# Patient Record
Sex: Male | Born: 1995 | Marital: Single
Health system: Southern US, Community
[De-identification: ages and names within clinical notes are randomized; demographics above are authoritative.]

---
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
## 2020-09-27 ENCOUNTER — Emergency Department (HOSPITAL_COMMUNITY): Payer: BLUE CROSS/BLUE SHIELD

## 2020-09-27 ENCOUNTER — Emergency Department (HOSPITAL_COMMUNITY)
Admission: EM | Admit: 2020-09-27 | Discharge: 2020-09-28 | Disposition: A | Discharge status: Home / Self Care | Payer: BLUE CROSS/BLUE SHIELD | Attending: Emergency Medicine | Admitting: Emergency Medicine

## 2020-09-27 ENCOUNTER — Other Ambulatory Visit: Payer: Self-pay

## 2020-09-27 DIAGNOSIS — R0602 Shortness of breath: Secondary | ICD-10-CM | POA: Diagnosis not present

## 2020-09-27 DIAGNOSIS — R072 Precordial pain: Secondary | ICD-10-CM

## 2020-09-27 DIAGNOSIS — F419 Anxiety disorder, unspecified: Secondary | ICD-10-CM | POA: Insufficient documentation

## 2020-09-27 LAB — CBC
HCT: 46.5 % (ref 39.0–52.0)
Hemoglobin: 16.3 g/dL (ref 13.0–17.0)
MCH: 31.5 pg (ref 26.0–34.0)
MCHC: 35.1 g/dL (ref 30.0–36.0)
MCV: 89.9 fL (ref 80.0–100.0)
Platelets: 229 10*3/uL (ref 150–400)
RBC: 5.17 MIL/uL (ref 4.22–5.81)
RDW: 11.9 % (ref 11.5–15.5)
WBC: 9.9 10*3/uL (ref 4.0–10.5)
nRBC: 0 % (ref 0.0–0.2)

## 2020-09-27 LAB — BASIC METABOLIC PANEL
Anion gap: 9 (ref 5–15)
BUN: 12 mg/dL (ref 6–20)
CO2: 23 mmol/L (ref 22–32)
Calcium: 10 mg/dL (ref 8.9–10.3)
Chloride: 107 mmol/L (ref 98–111)
Creatinine, Ser: 1.03 mg/dL (ref 0.61–1.24)
GFR, Estimated: >60 mL/min (ref >60)
Glucose, Bld: 89 mg/dL (ref 70–99)
Potassium: 3.8 mmol/L (ref 3.5–5.1)
Sodium: 139 mmol/L (ref 135–145)

## 2020-09-27 LAB — TROPONIN I (HIGH SENSITIVITY)
Troponin I (High Sensitivity): 2 ng/L (ref <18)
Troponin I (High Sensitivity): 3 ng/L (ref <18)

## 2020-09-27 MED ORDER — IOHEXOL 350 MG/ML SOLN
75.0000 mL | Freq: Once | INTRAVENOUS | Status: AC | PRN
  Administered 2020-09-27: 75 mL via INTRAVENOUS

## 2020-09-27 MED ORDER — HYDROXYZINE HCL 25 MG PO TABS
50.0000 mg | ORAL_TABLET | Freq: Once | ORAL | Status: AC
  Administered 2020-09-28: 50 mg via ORAL
  Filled 2020-09-27: qty 2

## 2020-09-27 MED ORDER — PANTOPRAZOLE SODIUM 40 MG IV SOLR
40.0000 mg | Freq: Once | INTRAVENOUS | Status: AC
  Administered 2020-09-27: 40 mg via INTRAVENOUS
  Filled 2020-09-27: qty 40

## 2020-09-27 NOTE — ED Triage Notes (Signed)
Pt c/o L sided chest pain radiating into L arm. States it has been going on x 1.5wks but getting worse today starting around 6pm. C/o difficulty getting a deep breath in and tried inhaler without relief. Hx asthma

## 2020-09-27 NOTE — ED Triage Notes (Signed)
Emergency Medicine Provider Triage Evaluation Note  Troy White , a 25 y.o. male  was evaluated in triage.  Pt complains of CP, difficulty catching breath, off and on for a few weeks, this episode started around 6PM while going out for dinner, visiting family in the area with pain to jaw and left arm, pain more severe than prior episodes, numbness to both hands. No relief with inhalers. 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 No lower extremity edema.  History of asthma, no other health problems.  Non smoker (occasional marijuana). Family history- father with MI 38yo (first MI at age 41yo), paternal grandfather with MI at young age unsure age. Maternal history of MI as well.  No family history of PE, grandmother with DVT for unknown reasons.   Review of Systems  Positive: Nausea, dry heaves, CP, SHOB Negative: Changes in bowel or bladder habits  Physical Exam  BP (!) 143/86 (BP Location: Left Arm)   Pulse 63   Temp 98.3 F (36.8 C) (Oral)   Resp 20   Ht 5\' 10"  (1.778 m)   Wt 65.8 kg   SpO2 99%   BMI 20.81 kg/m  Gen:   Awake, no distress   HEENT:  Atraumatic  Resp:  Normal effort  Cardiac:  Normal rate  Abd:   Nondistended, nontender  MSK:   Moves extremities without difficulty  Neuro:  Speech clear   Medical Decision Making  Medically screening exam initiated at 8:00 PM.  Appropriate orders placed.  Troy White was informed that the remainder of the evaluation will be completed by another provider, this initial triage assessment does not replace that evaluation, and the importance of remaining in the ED until their evaluation is complete.  Clinical Impression     Troy White 09/27/20 2005

## 2020-09-28 MED ORDER — OMEPRAZOLE 20 MG PO CPDR: 20.0000 mg | DELAYED_RELEASE_CAPSULE | Freq: Every day | ORAL | 0 refills | Status: AC

## 2020-09-28 MED ORDER — HYDROXYZINE HCL 50 MG PO TABS: 50.0000 mg | ORAL_TABLET | Freq: Four times a day (QID) | ORAL | 0 refills | Status: AC | PRN

## 2020-09-28 NOTE — Discharge Instructions (Signed)
1.  Start taking omeprazole daily.  Take this in the morning 30 minutes before you eat.  Follow instructions for gastroesophageal reflux disease. 2.  Review instructions for managing anxiety.  Have been given a prescription for Atarax.  You may take a tablet if you feel you are becoming anxious and cannot manage the symptoms through breathing and relaxation exercises. 3.  You were evaluated for chest pain with pain that radiated into your arm in your jaw.  There is no sign of a heart attack at this time.  CT scan was done without any signs of structural problems in the chest.  However, if your symptoms are persisting you may need further evaluation.  Tests such as echocardiograms and stress tests are done on an outpatient basis.  Follow-up with your doctor as soon as possible to determine if further testing is needed.

## 2020-09-28 NOTE — ED Provider Notes (Signed)
[facility]-EMERGENCY DEPT Provider Note   CSN: [id number] Arrival date & time: 09/27/20  1850     History Chief Complaint  Patient presents with  . Chest Pain    Troy White is a 25 y.o. male.  HPI Patient reports that he has intermittently been developing chest pain for a few weeks.  He reports that usually is in the center of his chest.  Oftentimes he wakes up with it.  He reports typically he does not think it is a significant problem but today it got severe and he experienced radiation into his jaw and his left arm.  He reports this is the worst episode he has experienced.  He has no prior history of cardiac disease or PE.  He does have mild asthma controlled with inhalers on as-needed basis.  He reports typically he uses his inhaler a couple times a week.  Patient reports that he has had recent very stressful event.  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  He does fly back and forth to be with his family.  Reports he did have a recent long flight.  He is not having any pain or swelling in the legs.  Family history is positive for multiple family members with early cardiac disease.  However, first MI in family members typically has been in the later 40s or 50s.  Patient reports he smokes occasional marijuana.  He has an alcohol beverage typically once a day.    No past medical history on file.  There are no problems to display for this patient.        No family history on file.     Home Medications Prior to Admission medications   Medication Sig Start Date End Date Taking? Authorizing Provider  albuterol (VENTOLIN HFA) 108 (90 Base) MCG/ACT inhaler Inhale 1-2 puffs into the lungs every 6 (six) hours as needed for wheezing or shortness of breath.   Yes [provider]  hydrOXYzine (ATARAX/VISTARIL) 50 MG tablet Take 1 tablet (50 mg total) by mouth every 6 (six) hours as needed for anxiety or nausea. 09/28/20  Yes Arby Barrette, MD  omeprazole (PRILOSEC) 20 MG capsule Take 1 capsule (20 mg total) by mouth daily. 09/28/20  Yes Arby Barrette, MD    Allergies    Penicillins  Review of Systems   Review of Systems 10 systems reviewed and negative except as per HPI Physical Exam Updated Vital Signs BP 131/82 (BP Location: Right Arm) Comment: Simultaneous filing. User may not have seen previous data.  Pulse 61 Comment: Simultaneous filing. User may not have seen previous data.  Temp 98.2 F (36.8 C) (Oral)   Resp 11 Comment: Simultaneous filing. User may not have seen previous data.  Ht 5\' 10"  (1.778 m)   Wt 65.8 kg   SpO2 100% Comment: Simultaneous filing. User may not have seen previous data.  BMI 20.81 kg/m   Physical Exam Constitutional:      Appearance: He is well-developed.  HENT:     Head: Normocephalic and atraumatic.     Mouth/Throat:     Mouth: Mucous membranes are moist.     Pharynx: Oropharynx is clear.  Eyes:     Extraocular Movements: Extraocular movements intact.     Conjunctiva/sclera: Conjunctivae normal.  Cardiovascular:     Rate and Rhythm: Normal rate and regular rhythm.     Heart sounds: Normal heart sounds.  Pulmonary:     Effort: Pulmonary effort is normal.  Breath sounds: Normal breath sounds.  Abdominal:     General: Bowel sounds are normal. There is no distension.     Palpations: Abdomen is soft.     Tenderness: There is no abdominal tenderness.  Musculoskeletal:        General: No swelling or tenderness. Normal range of motion.     Cervical back: Neck supple.  Skin:    General: Skin is warm and dry.  Neurological:     Mental Status: He is alert and oriented to person, place, and time.     GCS: GCS eye subscore is 4. GCS verbal subscore is 5. GCS motor subscore is 6.     Coordination: Coordination normal.  Psychiatric:        Mood and Affect: Mood normal.     ED Results / Procedures / Treatments   Labs (all labs ordered are listed, but only abnormal  results are displayed) Labs Reviewed  BASIC METABOLIC PANEL  CBC  TROPONIN I (HIGH SENSITIVITY)  TROPONIN I (HIGH SENSITIVITY)    EKG EKG Interpretation  Date/Time:  Monday Sep 27 2020 19:49:33 EDT Ventricular Rate:  68 PR Interval:  138 QRS Duration: 94 QT Interval:  372 QTC Calculation: 395 R Axis:   87 Text Interpretation: Sinus rhythm with marked sinus arrhythmia Otherwise normal ECG no acute ischemic changes. Confirmed by Arby Barrette [phone]) on 09/27/2020 9:51:21 PM   Radiology CT Angio Chest PE W/Cm &/Or Wo Cm  Result Date: 09/27/2020 CLINICAL DATA:  Chest pain.  Right-sided arm pain. EXAM: CT ANGIOGRAPHY CHEST WITH CONTRAST TECHNIQUE: Multidetector CT imaging of the chest was performed using the standard protocol during bolus administration of intravenous contrast. Multiplanar CT image reconstructions and MIPs were obtained to evaluate the vascular anatomy. CONTRAST:  66mL OMNIPAQUE IOHEXOL 350 MG/ML SOLN COMPARISON:  None. FINDINGS: Cardiovascular: Contrast injection is sufficient to demonstrate satisfactory opacification of the pulmonary arteries to the segmental level. There is no pulmonary embolus or evidence of right heart strain. The size of the main pulmonary artery is normal. Heart size is normal, with no pericardial effusion. The course and caliber of the aorta are normal. There is no atherosclerotic calcification. Opacification decreased due to pulmonary arterial phase contrast bolus timing. Mediastinum/Nodes: -- No mediastinal lymphadenopathy. -- No hilar lymphadenopathy. -- No axillary lymphadenopathy. -- No supraclavicular lymphadenopathy. -- Normal thyroid gland where visualized. -  Unremarkable esophagus. Lungs/Pleura: Airways are patent. No pleural effusion, lobar consolidation, pneumothorax or pulmonary infarction. Upper Abdomen: Contrast bolus timing is not optimized for evaluation of the abdominal organs. The visualized portions of the organs of the upper abdomen  are normal. Musculoskeletal: No chest wall abnormality. No bony spinal canal stenosis. Review of the MIP images confirms the above findings. IMPRESSION: Unremarkable exam. Electronically Signed   By: Katherine Mantle M.D.   On: 09/27/2020 23:48   DG Chest Port 1 View  Result Date: 09/27/2020 CLINICAL DATA:  Low fall EXAM: PORTABLE CHEST 1 VIEW COMPARISON:  May 30, 2012 FINDINGS: The heart size and mediastinal contours are within normal limits. Both lungs are clear. The visualized skeletal structures are unremarkable. IMPRESSION: No active disease. Electronically Signed   By: Katherine Mantle M.D.   On: 09/27/2020 20:33    Procedures Procedures   Medications Ordered in ED Medications  pantoprazole (PROTONIX) injection 40 mg (40 mg Intravenous Given 09/27/20 2213)  iohexol (OMNIPAQUE) 350 MG/ML injection 75 mL (75 mLs Intravenous Contrast Given 09/27/20 2317)  hydrOXYzine (ATARAX/VISTARIL) tablet 50 mg (50 mg Oral Given 09/28/20  Martie.[email or web address])    ED Course  I have reviewed the triage vital signs and the nursing notes.  Pertinent labs & imaging results that were available during my care of the patient were reviewed by me and considered in my medical decision making (see chart for details).    MDM Rules/Calculators/A&P                           Patient presents as outlined with pain in his chest radiating to the arm and the jaw.  Worse with deep inspiration and perception of having difficulty taking a deep breath.  He had an episode tonight associated with nausea and dry heaves.  Patient has low risk factors for significant cardiac disease except family history.  2 sets of troponins negative and EKG without ischemic pattern.  ACS low probability.  Patient has had frequent long travel and dyspnea.  CT PE study negative for PE.  No other structural anomalies identified.  No signs of infectious etiology at this time.  Patient does describe history of reflux symptoms and takes Tums fairly frequently.   Reports at one point he was on daily omeprazole but discontinued it.  Will reinstitute omeprazole for a month.  Patient also describes recent escalation in anxiety.  Sideration given to panic attack.  Will prescribe Atarax on as-needed basis.  Patient advised to get scheduled with both the PCP to continue monitoring any issues regarding chest pain and further work-up if needed and for referral and follow-up with a counselor for management of anxiety through cognitive behavioral measures.  Final Clinical Impression(s) / ED Diagnoses Final diagnoses:  Precordial chest pain  Shortness of breath  Anxiety    Rx / DC Orders ED Discharge Orders         Ordered    hydrOXYzine (ATARAX/VISTARIL) 50 MG tablet  Every 6 hours PRN        09/28/20 0003    omeprazole (PRILOSEC) 20 MG capsule  Daily        09/28/20 0003           Arby Barrette, MD 09/28/20 [phone]

## 2021-12-04 IMAGING — CT CT ANGIO CHEST
3 of 7 series · 17 of 36 positions shown · IV contrast (omnipaque)
Comparison: None.

CLINICAL DATA: Chest pain.  Right-sided arm pain.

EXAM:
CT ANGIOGRAPHY CHEST WITH CONTRAST
TECHNIQUE: Multidetector CT imaging of the chest was performed using the
standard protocol during bolus administration of intravenous
contrast. Multiplanar CT image reconstructions and MIPs were
obtained to evaluate the vascular anatomy.
CONTRAST:  75mL OMNIPAQUE IOHEXOL 350 MG/ML SOLN

[Series 5: thins · axial · 0.78mm/px · z∈[-410,-98]mm · 12 of 370 slices shown]
[im 29/370  lung]
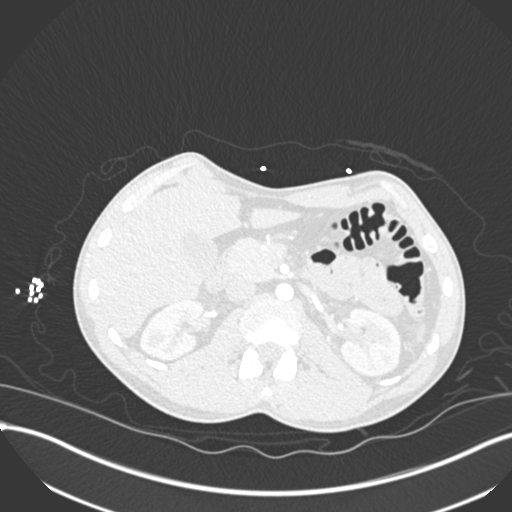
[im 57/370  mediastinal]
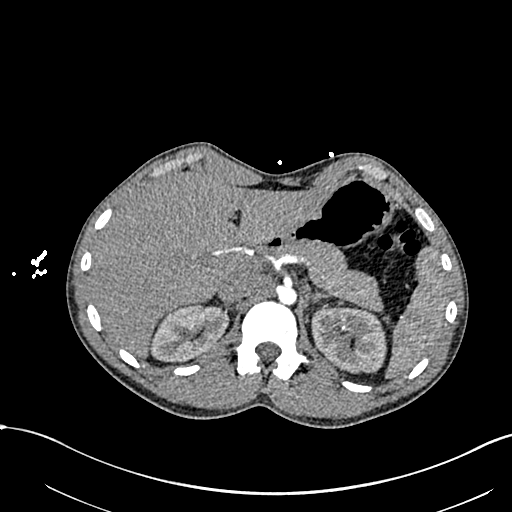
[im 86/370  lung]
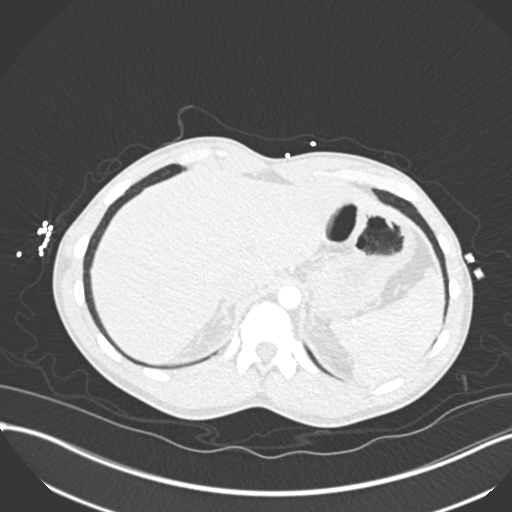
[im 114/370  mediastinal]
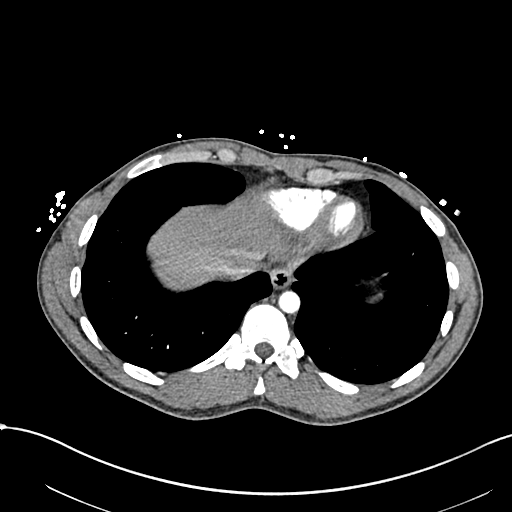
[im 142/370  lung]
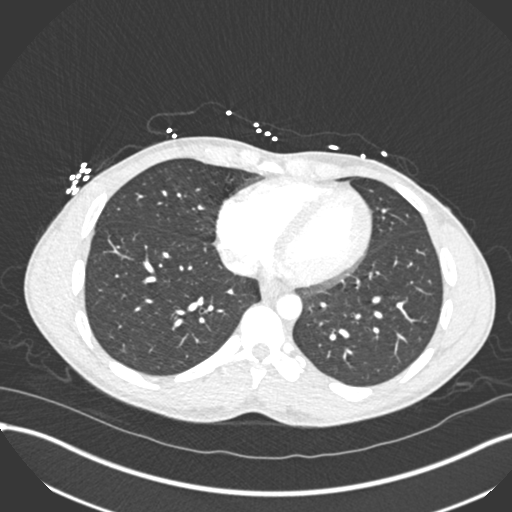
[im 171/370  mediastinal]
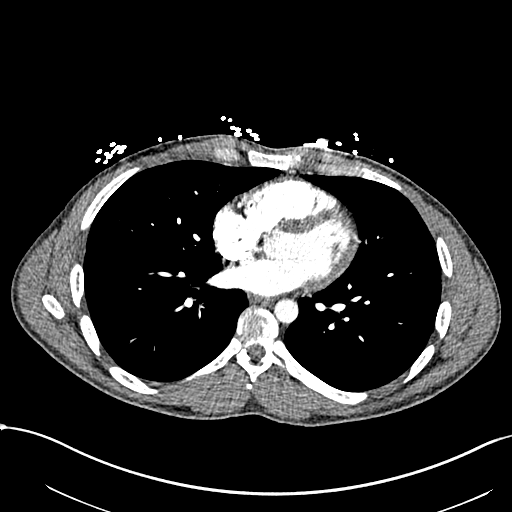
[im 199/370  lung]
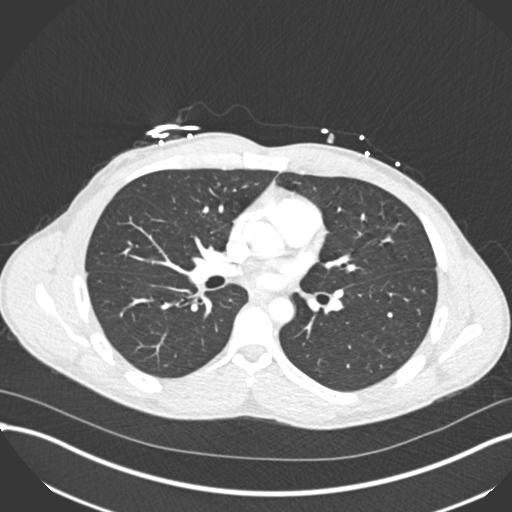
[im 228/370  mediastinal]
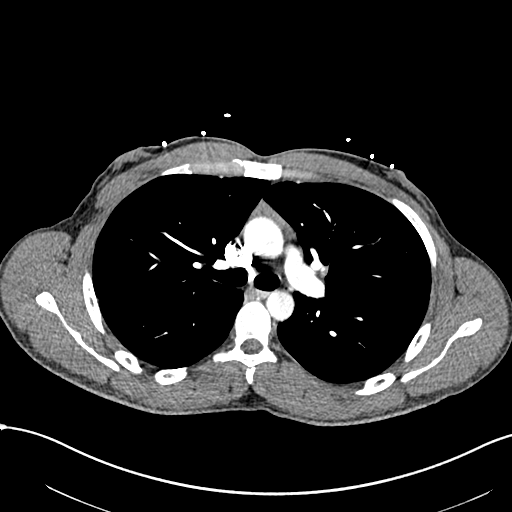
[im 256/370  lung]
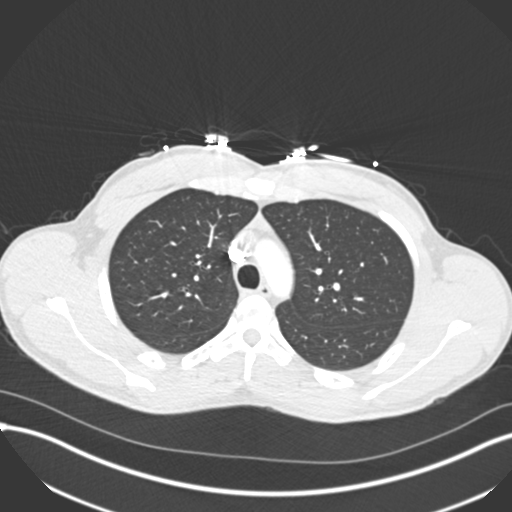
[im 284/370  mediastinal]
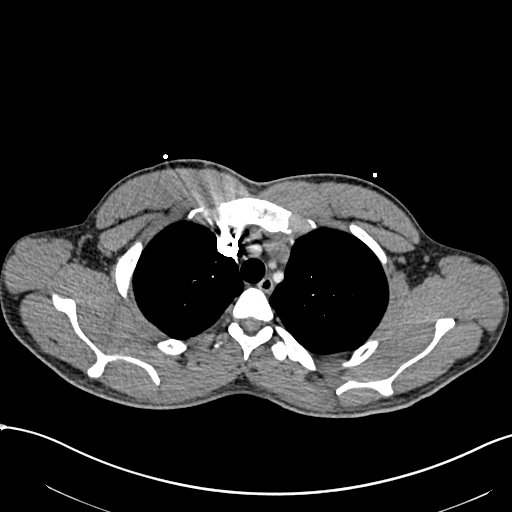
[im 313/370  lung]
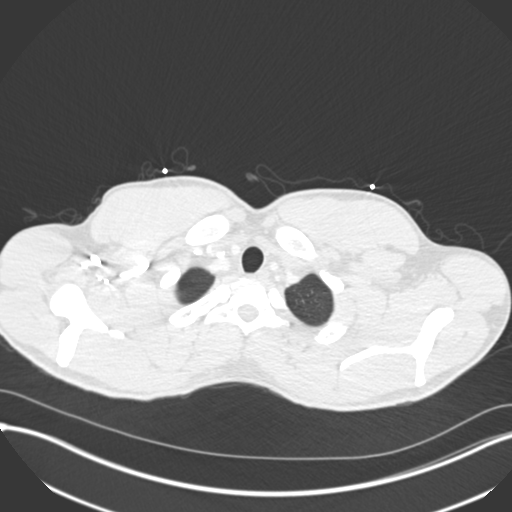
[im 341/370  mediastinal]
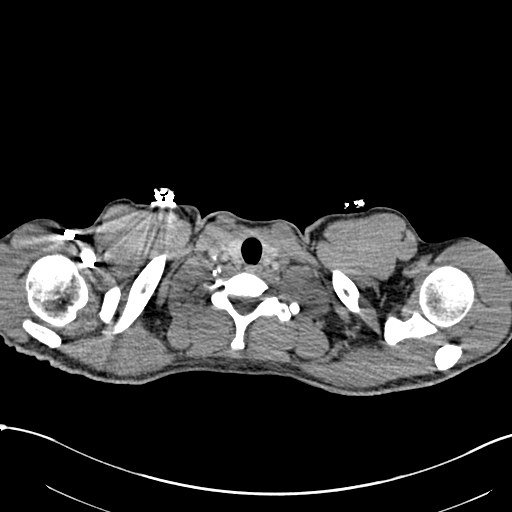

[Series 6: coronal mpr · coronal · 0.71mm/px · 1 of 117 slices shown]
[im 59/117  mediastinal]
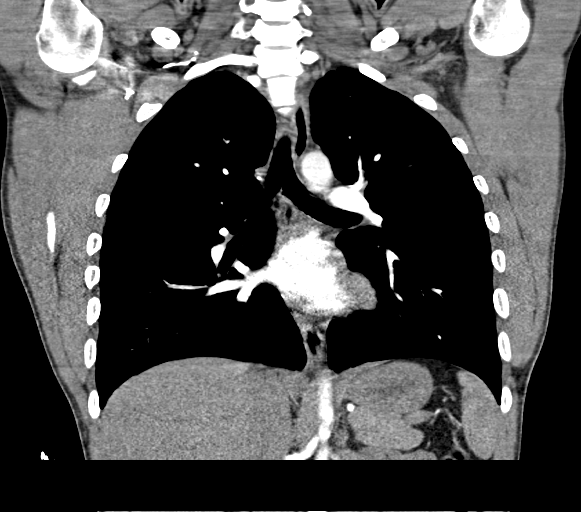

[Series 10: lung · axial · 0.78mm/px · z∈[-325,-133]mm · 4 of 160 slices shown]
[im 32/160  mediastinal]
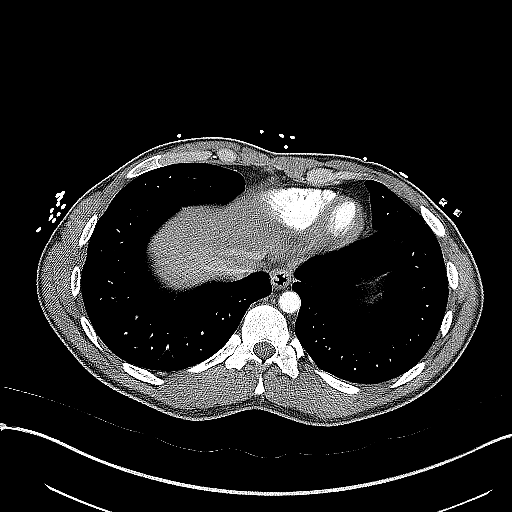
[im 64/160  mediastinal]
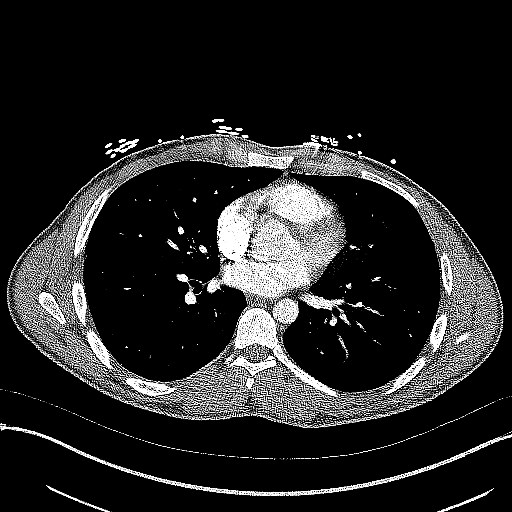
[im 96/160  mediastinal]
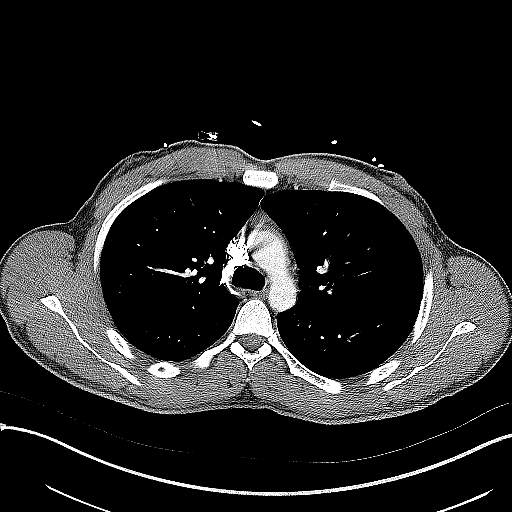
[im 128/160  mediastinal]
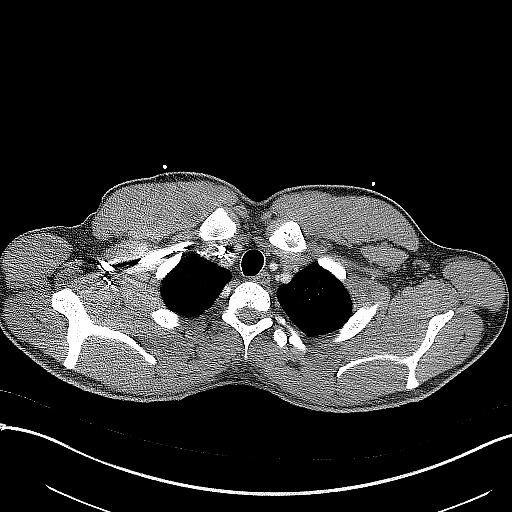

[17 of 36 positions shown; findings below may reference images not displayed]

FINDINGS: Cardiovascular: Contrast injection is sufficient to demonstrate
satisfactory opacification of the pulmonary arteries to the
segmental level. There is no pulmonary embolus or evidence of right
heart strain. The size of the main pulmonary artery is normal. Heart
size is normal, with no pericardial effusion. The course and caliber
of the aorta are normal. There is no atherosclerotic calcification.
Opacification decreased due to pulmonary arterial phase contrast
bolus timing.

Mediastinum/Nodes:

-- No mediastinal lymphadenopathy.

-- No hilar lymphadenopathy.

-- No axillary lymphadenopathy.

-- No supraclavicular lymphadenopathy.

-- Normal thyroid gland where visualized.

-  Unremarkable esophagus.

Lungs/Pleura: Airways are patent. No pleural effusion, lobar
consolidation, pneumothorax or pulmonary infarction.

Upper Abdomen: Contrast bolus timing is not optimized for evaluation
of the abdominal organs. The visualized portions of the organs of
the upper abdomen are normal.

Musculoskeletal: No chest wall abnormality. No bony spinal canal
stenosis.

Review of the MIP images confirms the above findings.
IMPRESSION: Unremarkable exam.

## 2021-12-04 IMAGING — DX DG CHEST 1V PORT
2 series · 2 of 2 positions shown · non-contrast
Comparison: May 30, 2012

CLINICAL DATA: Low fall

EXAM:
PORTABLE CHEST 1 VIEW

[chest ap (1 of 2)]
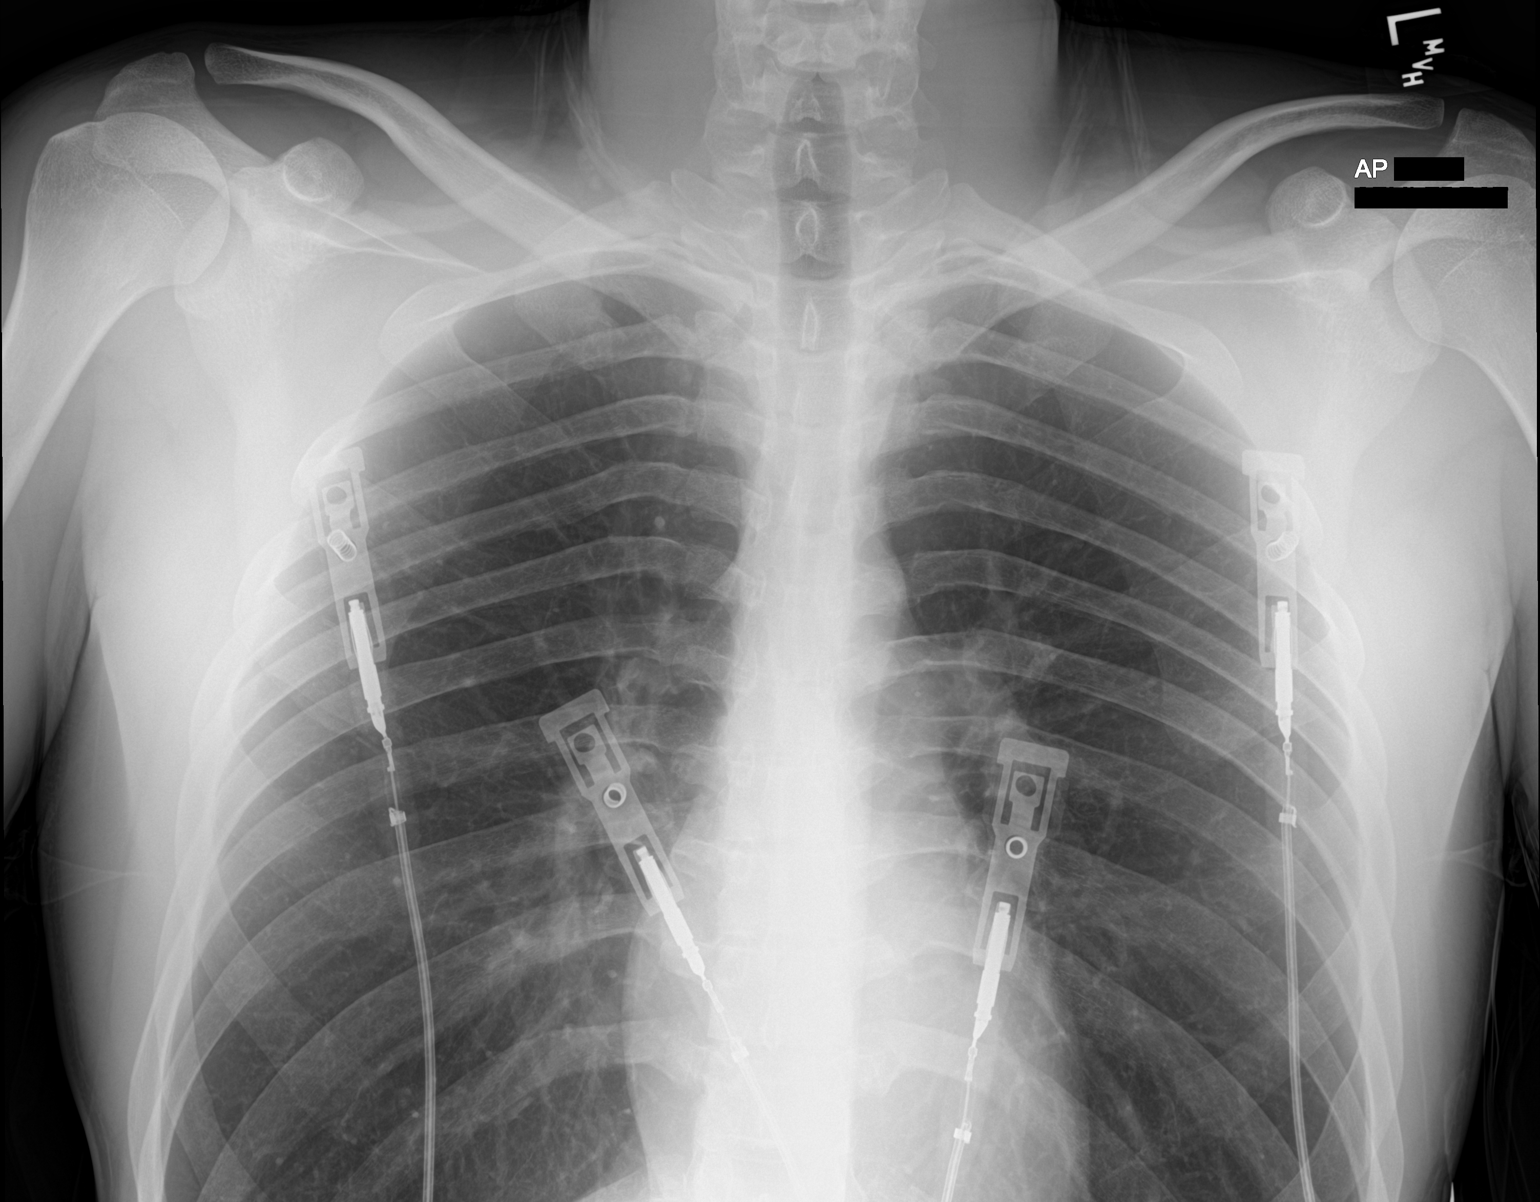

[chest ap (2 of 2)]
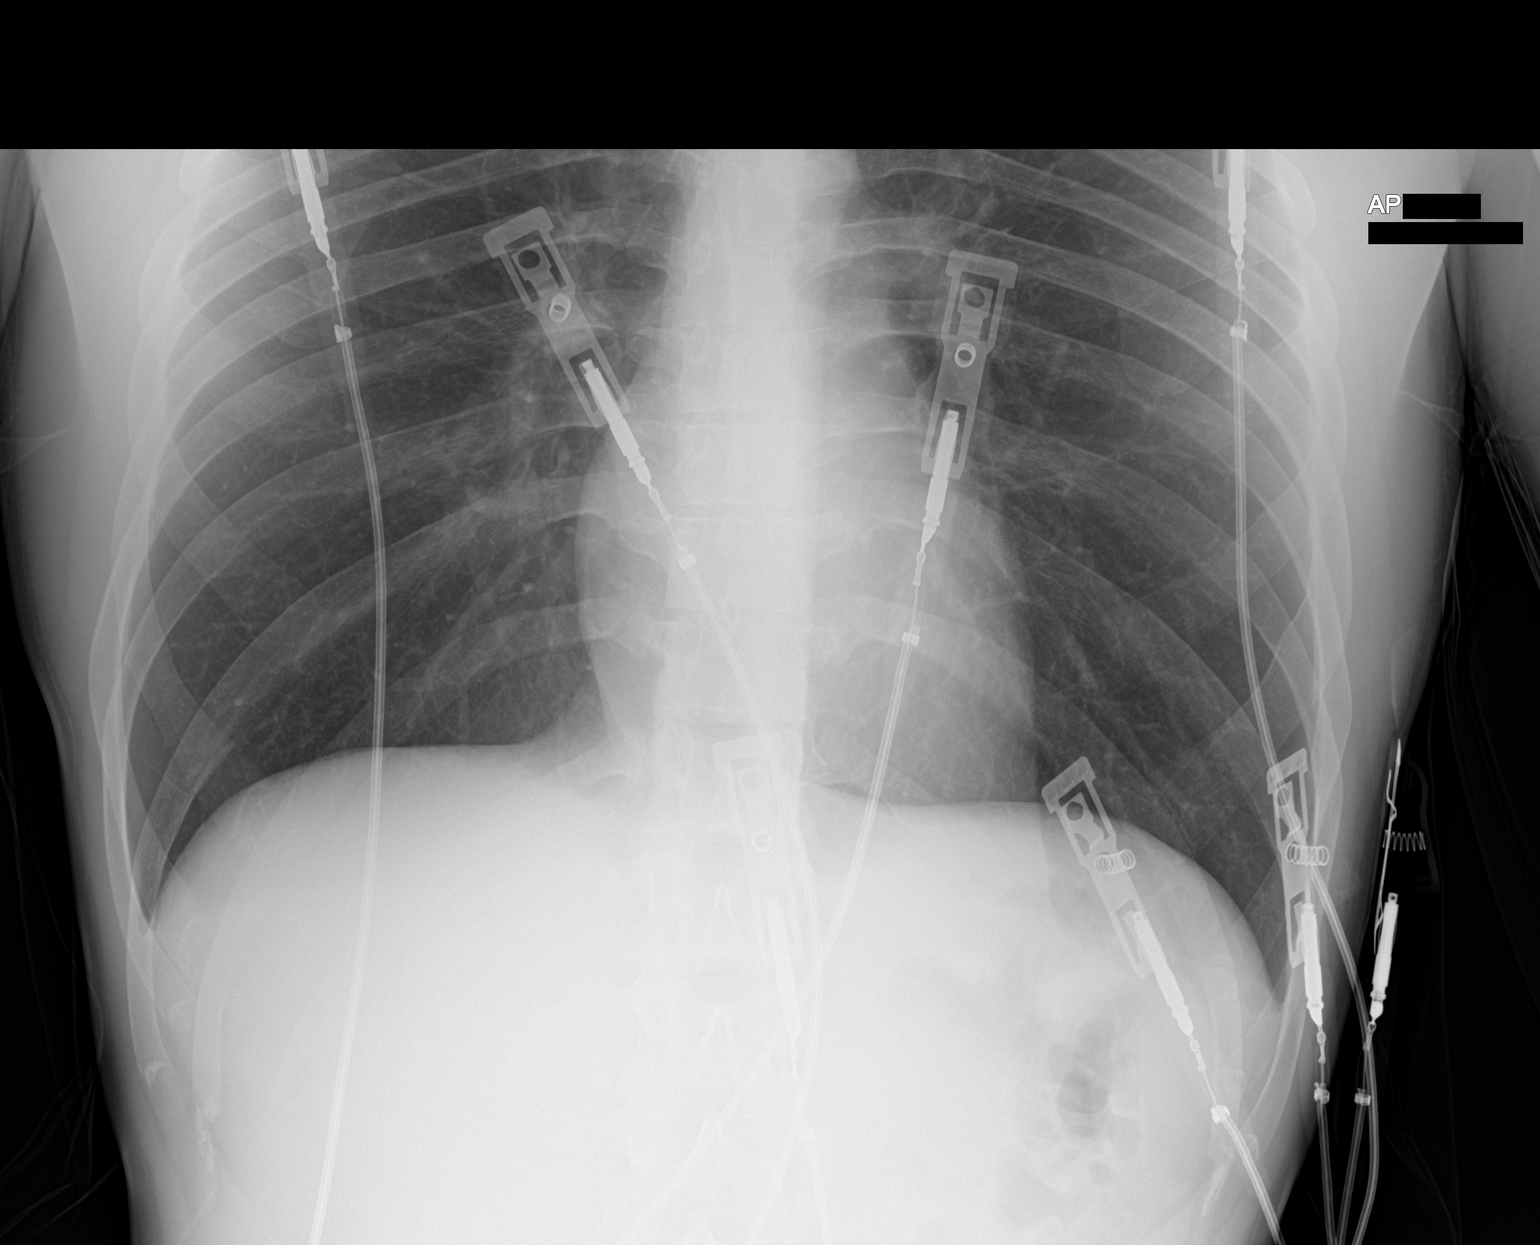

[2 of 2 positions shown; findings below may reference images not displayed]

FINDINGS: The heart size and mediastinal contours are within normal limits.
Both lungs are clear. The visualized skeletal structures are
unremarkable.
IMPRESSION: No active disease.
